# Patient Record
Sex: Female | Born: 2000 | Race: White | Hispanic: No | Marital: Single | Smoking: Never smoker
Health system: Southern US, Community
[De-identification: ages and names within clinical notes are randomized; demographics above are authoritative.]

## PROBLEM LIST (undated history)

## (undated) DIAGNOSIS — G43909 Migraine, unspecified, not intractable, without status migrainosus: Secondary | ICD-10-CM

## (undated) DIAGNOSIS — T7840XA Allergy, unspecified, initial encounter: Secondary | ICD-10-CM

## (undated) HISTORY — PX: MOUTH SURGERY: SHX715

---
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
## 2020-01-27 ENCOUNTER — Other Ambulatory Visit: Payer: Self-pay

## 2020-01-27 ENCOUNTER — Ambulatory Visit
Admission: EM | Admit: 2020-01-27 | Discharge: 2020-01-27 | Disposition: A | Discharge status: Home / Self Care | Payer: PRIVATE HEALTH INSURANCE | Attending: Physician Assistant | Admitting: Physician Assistant

## 2020-01-27 DIAGNOSIS — J020 Streptococcal pharyngitis: Secondary | ICD-10-CM | POA: Diagnosis present

## 2020-01-27 DIAGNOSIS — R591 Generalized enlarged lymph nodes: Secondary | ICD-10-CM

## 2020-01-27 HISTORY — DX: Allergy, unspecified, initial encounter: T78.40XA

## 2020-01-27 HISTORY — DX: Migraine, unspecified, not intractable, without status migrainosus: G43.909

## 2020-01-27 LAB — GROUP A STREP BY PCR: Group A Strep by PCR: DETECTED — AB

## 2020-01-27 MED ORDER — AMOXICILLIN 500 MG PO CAPS: 500.0000 mg | ORAL_CAPSULE | Freq: Two times a day (BID) | ORAL | 0 refills | Status: AC

## 2020-01-27 MED ORDER — IBUPROFEN 800 MG PO TABS: 800.0000 mg | ORAL_TABLET | Freq: Three times a day (TID) | ORAL | 0 refills | Status: AC

## 2020-01-27 NOTE — ED Triage Notes (Addendum)
Pt with left neck swelling x past 2-3 weeks. 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 States it started out as regular "allergy" symptoms with runny nose but went away 2 weeks ago but the neck swelling and tenderness remains.

## 2020-01-27 NOTE — Discharge Instructions (Signed)
I will call with the results of your strep test.  You have declined a mono test and a blood draw today to check CBC.  Swollen lymph nodes can last for couple weeks but they usually go down.  Supportive care with ibuprofen as I have prescribed.  You can also take over-the-counter Tylenol for pain relief.  You can try warm compresses to see if that will help as well.  You should be seen again if your symptoms last more than 2 more weeks or you develop a fever or any symptoms with that including sore throat, cough or other symptoms.

## 2020-01-27 NOTE — ED Provider Notes (Addendum)
[facility]    CSN: [id number] Arrival date & time: 01/27/20  1544      History   Chief Complaint Chief Complaint  Patient presents with  . Lymphadenopathy    HPI Gail Harvey is a 19 y.o. female presenting for a swollen lymph node of the left side of her neck for the past 2 to 3 weeks.  She says that she did have a cough and some congestion at onset, but she started taking over-the-counter antihistamines for allergies and that improved the congestion and cough.  She denies any sore throat, but says that she thinks it feels weird when she swallows.  Denies any fever, fatigue, body aches, ear pain, sinus pain, cough, chest discomfort, breathing difficulty.  No known exposure to strep, mono, flu, Covid and no one has been ill around her recently.  She says that the lymph node has not gotten smaller or bigger.  She has occasionally taken over-the-counter NSAIDs with temporary improvement in the pain.  She has no other symptoms to report no other complaints today.  HPI  Past Medical History:  Diagnosis Date  . Allergies   . Migraines     There are no problems to display for this patient.   Past Surgical History:  Procedure Laterality Date  . MOUTH SURGERY      OB History   No obstetric history on file.      Home Medications    Prior to Admission medications   Medication Sig Start Date End Date Taking? Authorizing Provider  ibuprofen (ADVIL) 800 MG tablet Take 1 tablet (800 mg total) by mouth 3 (three) times daily for 10 days. 01/27/20 02/06/20  Eusebio Friendly B, PA-C  rizatriptan (MAXALT-MLT) 10 MG disintegrating tablet Take 1 tablet (10 mg total) by mouth as needed for migraine. May repeat in 2 hours if needed 06/05/16   Payton Mccallum, MD    Family History History reviewed. No pertinent family history.  Social History Social History   Tobacco Use  . Smoking status: Never Smoker  . Smokeless tobacco: Never Used  Vaping Use  . Vaping Use: Never used    Substance Use Topics  . Alcohol use: No  . Drug use: No     Allergies   Patient has no known allergies.   Review of Systems Review of Systems  Constitutional: Negative for chills, diaphoresis, fatigue and fever.  HENT: Negative for congestion, ear pain, rhinorrhea, sinus pressure, sinus pain and sore throat.   Respiratory: Negative for cough and shortness of breath.   Gastrointestinal: Negative for abdominal pain, nausea and vomiting.  Musculoskeletal: Negative for arthralgias and myalgias.  Skin: Negative for rash.  Neurological: Negative for weakness and headaches.  Hematological: Positive for adenopathy.     Physical Exam Triage Vital Signs ED Triage Vitals  Enc Vitals Group     BP 01/27/20 1611 125/75     Pulse Rate 01/27/20 1611 79     Resp 01/27/20 1611 16     Temp 01/27/20 1611 98.1 F (36.7 C)     Temp Source 01/27/20 1611 Oral     SpO2 01/27/20 1611 100 %     Weight 01/27/20 1604 160 lb (72.6 kg)     Height 01/27/20 1604 5\' 7"  (1.702 m)     Head Circumference --      Peak Flow --      Pain Score 01/27/20 1604 6     Pain Loc --      Pain  Edu? --      Excl. in GC? --    No data found.  Updated Vital Signs BP 125/75 (BP Location: Left Arm)   Pulse 79   Temp 98.1 F (36.7 C) (Oral)   Resp 16   Ht 5\' 7"  (1.702 m)   Wt 160 lb (72.6 kg)   LMP 01/19/2020   SpO2 100%   BMI 25.06 kg/m    Physical Exam Vitals and nursing note reviewed.  Constitutional:      General: She is not in acute distress.    Appearance: Normal appearance. She is not ill-appearing or toxic-appearing.  HENT:     Head: Normocephalic and atraumatic.     Right Ear: Tympanic membrane, ear canal and external ear normal.     Left Ear: Tympanic membrane, ear canal and external ear normal.     Nose: Nose normal.     Mouth/Throat:     Mouth: Mucous membranes are moist.     Pharynx: Oropharynx is clear. No posterior oropharyngeal erythema.  Eyes:     General: No scleral icterus.        Right eye: No discharge.        Left eye: No discharge.     Conjunctiva/sclera: Conjunctivae normal.  Cardiovascular:     Rate and Rhythm: Normal rate and regular rhythm.     Heart sounds: Normal heart sounds.  Pulmonary:     Effort: Pulmonary effort is normal. No respiratory distress.     Breath sounds: Normal breath sounds.  Musculoskeletal:     Cervical back: Neck supple.  Lymphadenopathy:     Cervical: Cervical adenopathy (small enlarged and tender lymph node anterior cervical region. It is TTP) present.  Skin:    General: Skin is dry.  Neurological:     General: No focal deficit present.     Mental Status: She is alert. Mental status is at baseline.     Motor: No weakness.     Gait: Gait normal.  Psychiatric:        Mood and Affect: Mood normal.        Behavior: Behavior normal.        Thought Content: Thought content normal.      UC Treatments / Results  Labs (all labs ordered are listed, but only abnormal results are displayed) Labs Reviewed  GROUP A STREP BY PCR    EKG   Radiology No results found.  Procedures Procedures (including critical care time)  Medications Ordered in UC Medications - No data to display  Initial Impression / Assessment and Plan / UC Course  I have reviewed the triage vital signs and the nursing notes.  Pertinent labs & imaging results that were available during my care of the patient were reviewed by me and considered in my medical decision making (see chart for details).    19 year old female presenting for swollen lymph node of the left side of her neck for the past 2 to 3 weeks.  Strep testing obtained today.  Advised patient we will call her with results if positive.  I did offer her a mono test and a CBC.  Patient declined at this time and agreed to go forward with conservative treatment.  I have sent ibuprofen to the pharmacy for her and advised increasing rest and fluids.  I reassured her that most lymph node swelling  comes down in a couple of weeks.  Advised her to make follow-up with her PCP if she needs further work-up  including the testing that I have offered and/or ultrasound of the region.  Advised her to follow-up sooner here if she develops any other symptoms.  She is agreeable.  Molecular strep test positive discussed results with patient.  Advised her that I sent amoxicillin.  Advised her to take the full course and explained that the lymph node should go down as the infection started to clear up.  Advised her to follow-up with our department as needed.   Final Clinical Impressions(s) / UC Diagnoses   Final diagnoses:  Lymphadenopathy of head and neck     Discharge Instructions     I will call with the results of your strep test.  You have declined a mono test and a blood draw today to check CBC.  Swollen lymph nodes can last for couple weeks but they usually go down.  Supportive care with ibuprofen as I have prescribed.  You can also take over-the-counter Tylenol for pain relief.  You can try warm compresses to see if that will help as well.  You should be seen again if your symptoms last more than 2 more weeks or you develop a fever or any symptoms with that including sore throat, cough or other symptoms.   ED Prescriptions    Medication Sig Dispense Auth. Provider   ibuprofen (ADVIL) 800 MG tablet Take 1 tablet (800 mg total) by mouth 3 (three) times daily for 10 days. 30 tablet Gareth Morgan     PDMP not reviewed this encounter.   Shirlee Latch, PA-C 01/27/20 1657    Eusebio Friendly B, PA-C 01/27/20 1800

## 2020-05-09 ENCOUNTER — Other Ambulatory Visit: Payer: Self-pay

## 2020-05-09 ENCOUNTER — Ambulatory Visit (INDEPENDENT_AMBULATORY_CARE_PROVIDER_SITE_OTHER): Payer: PRIVATE HEALTH INSURANCE

## 2020-05-09 ENCOUNTER — Ambulatory Visit
Admission: RE | Admit: 2020-05-09 | Discharge: 2020-05-09 | Disposition: A | Discharge status: Home / Self Care | Payer: PRIVATE HEALTH INSURANCE | Source: Ambulatory Visit | Attending: Physician Assistant | Admitting: Physician Assistant

## 2020-05-09 VITALS — BP 120/76 | HR 77 | Temp 98.5°F | Resp 18 | Ht 67.0 in | Wt 160.1 lb

## 2020-05-09 DIAGNOSIS — M7712 Lateral epicondylitis, left elbow: Secondary | ICD-10-CM

## 2020-05-09 DIAGNOSIS — M25522 Pain in left elbow: Secondary | ICD-10-CM | POA: Diagnosis not present

## 2020-05-09 DIAGNOSIS — X500XXA Overexertion from strenuous movement or load, initial encounter: Secondary | ICD-10-CM

## 2020-05-09 MED ORDER — DICLOFENAC SODIUM 1 % EX GEL: 2.0000 g | Freq: Four times a day (QID) | CUTANEOUS | 0 refills | Status: AC

## 2020-05-09 MED ORDER — NAPROXEN 500 MG PO TABS: 500.0000 mg | ORAL_TABLET | Freq: Two times a day (BID) | ORAL | 0 refills | Status: AC

## 2020-05-09 NOTE — ED Triage Notes (Signed)
Pt c/o left elbow pain. Started about 3 weeks ago. 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 She has tried muscle relaxer and a compression sleeve and pain is still getting worse.

## 2020-05-09 NOTE — Discharge Instructions (Addendum)
Your x-ray is normal today.  You likely have inflammation at the lateral epicondyle.  This is probably due to overuse of the elbow.  Try to reduce your workload a little.  I have sent a topical anti-inflammatory gel for you to use as well as an oral anti-inflammatory.  You can also take Tylenol up to 2 tablets 3 times a day if needed for more pain relief.  Also continue to use ice and/or heat if that seems to help.  He may need to follow-up with orthopedics if this is not getting better over the next couple of weeks.  They may consider physical therapy.

## 2020-05-09 NOTE — ED Provider Notes (Signed)
[facility]    CSN: [id number] Arrival date & time: 05/09/20  1547      History   Chief Complaint Chief Complaint  Patient presents with  . Elbow Pain    left    HPI RASHONDA WARRIOR is a 20 y.o. female presenting for left elbow pain for about 3 weeks.  Patient says that her pain is constant and about 3 out of 10 during the day and more severe in the evening.  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  Patient is right-handed.  Denies any problems with the right extremity.  Patient says the entire elbow hurts.  Denies any radiation pain up or down the arm.  Denies any associated swelling, bruising, or redness.  Patient has taken muscle relaxers at bedtime and says that helps her sleep sometimes.  She says she is typically okay during the day but symptoms always worsen at night.  Patient says that she is off on the weekends and her pain does not really bother her.  She has tried ibuprofen a couple of times without any relief.  Patient denies any major injuries involving this extremity in the past.  She says she has never had any trauma to the elbow or arm.  No previous surgeries.  Denies any other complaints.  HPI  Past Medical History:  Diagnosis Date  . Allergies   . Migraines     There are no problems to display for this patient.   Past Surgical History:  Procedure Laterality Date  . MOUTH SURGERY      OB History   No obstetric history on file.      Home Medications    Prior to Admission medications   Medication Sig Start Date End Date Taking? Authorizing Provider  diclofenac Sodium (VOLTAREN) 1 % GEL Apply 2 g topically 4 (four) times daily for 7 days. 05/09/20 05/16/20 Yes Eusebio Friendly B, PA-C  naproxen (NAPROSYN) 500 MG tablet Take 1 tablet (500 mg total) by mouth 2 (two) times daily. 05/09/20 06/08/20 Yes Eusebio Friendly B, PA-C  rizatriptan  (MAXALT-MLT) 10 MG disintegrating tablet Take 1 tablet (10 mg total) by mouth as needed for migraine. May repeat in 2 hours if needed 06/05/16   Payton Mccallum, MD    Family History History reviewed. No pertinent family history.  Social History Social History   Tobacco Use  . Smoking status: Never Smoker  . Smokeless tobacco: Never Used  Vaping Use  . Vaping Use: Never used  Substance Use Topics  . Alcohol use: No  . Drug use: No     Allergies   Patient has no known allergies.   Review of Systems Review of Systems  Constitutional: Negative for fatigue and fever.  Musculoskeletal: Positive for arthralgias. Negative for joint swelling.  Skin: Negative for rash and wound.  Neurological: Negative for weakness and numbness.     Physical Exam Triage Vital Signs ED Triage Vitals  Enc Vitals Group     BP 05/09/20 1558 120/76     Pulse Rate 05/09/20 1558 77     Resp 05/09/20 1558 18     Temp 05/09/20 1558 98.5 F (36.9 C)     Temp Source 05/09/20 1558 Oral     SpO2 05/09/20 1558 100 %     Weight 05/09/20 1556 160 lb 0.9 oz (  72.6 kg)     Height 05/09/20 1556 5\' 7"  (1.702 m)     Head Circumference --      Peak Flow --      Pain Score 05/09/20 1556 7     Pain Loc --      Pain Edu? --      Excl. in GC? --    No data found.  Updated Vital Signs BP 120/76 (BP Location: Right Arm)   Pulse 77   Temp 98.5 F (36.9 C) (Oral)   Resp 18   Ht 5\' 7"  (1.702 m)   Wt 160 lb 0.9 oz (72.6 kg)   LMP 04/16/2020 (Approximate)   SpO2 100%   BMI 25.07 kg/m       Physical Exam Vitals and nursing note reviewed.  Constitutional:      General: She is not in acute distress.    Appearance: Normal appearance. She is not ill-appearing or toxic-appearing.  HENT:     Head: Normocephalic and atraumatic.  Eyes:     General: No scleral icterus.       Right eye: No discharge.        Left eye: No discharge.     Conjunctiva/sclera: Conjunctivae normal.  Cardiovascular:     Rate and  Rhythm: Normal rate and regular rhythm.     Pulses: Normal pulses.  Pulmonary:     Effort: Pulmonary effort is normal. No respiratory distress.  Musculoskeletal:     Left elbow: No swelling. Normal range of motion. Tenderness present in medial epicondyle and lateral epicondyle.     Cervical back: Neck supple.     Comments: No TTP of extensor or flexor tendons of forearm. Full ROM, but some pain with internal rotation  Skin:    General: Skin is dry.  Neurological:     General: No focal deficit present.     Mental Status: She is alert. Mental status is at baseline.     Motor: No weakness.     Gait: Gait normal.  Psychiatric:        Mood and Affect: Mood normal.        Behavior: Behavior normal.        Thought Content: Thought content normal.      UC Treatments / Results  Labs (all labs ordered are listed, but only abnormal results are displayed) Labs Reviewed - No data to display  EKG   Radiology DG Elbow Complete Left  Result Date: 05/09/2020 CLINICAL DATA:  Is elbow pain EXAM: LEFT ELBOW - COMPLETE 3+ VIEW COMPARISON:  None. FINDINGS: There is no evidence of fracture, dislocation, or joint effusion. There is no evidence of arthropathy or other focal bone abnormality. Soft tissues are unremarkable. IMPRESSION: Negative. Electronically Signed   By: 04/18/2020 M.D.   On: 05/09/2020 16:35    Procedures Procedures (including critical care time)  Medications Ordered in UC Medications - No data to display  Initial Impression / Assessment and Plan / UC Course  I have reviewed the triage vital signs and the nursing notes.  Pertinent labs & imaging results that were available during my care of the patient were reviewed by me and considered in my medical decision making (see chart for details).   20 year old female presenting for left elbow pain for a couple of weeks.  No specific injury but does overuse the elbow on a daily basis for work.  Imaging obtained today which is  negative for any acute bony abnormality.  Advised patient  that her exam is consistent with suspected lateral epicondylitis and also probably some level of medial epicondylitis.  Suggested supportive care at this time with RICE and Voltaren gel as well as naproxen and Tylenol for pain relief.  Encouraged her to follow-up with orthopedics if not getting better in the next couple weeks as she may need physical therapy or other treatment.  Work note provided for lighter duty for the next week.   Final Clinical Impressions(s) / UC Diagnoses   Final diagnoses:  Left elbow pain  Lateral epicondylitis, left elbow     Discharge Instructions     Your x-ray is normal today.  He will likely have inflammation at the lateral epicondyle.  This is probably due to overuse of the elbow.  Try to reduce your workload a little.  I have sent a topical anti-inflammatory gel for you to use as well as an oral anti-inflammatory.  You can also take Tylenol up to 2 tablets 3 times a day if needed for more pain relief.  Also continue to use ice and/or heat if that seems to help.  He may need to follow-up with orthopedics if this is not getting better over the next couple of weeks.  They may consider physical therapy.    ED Prescriptions    Medication Sig Dispense Auth. Provider   diclofenac Sodium (VOLTAREN) 1 % GEL Apply 2 g topically 4 (four) times daily for 7 days. 50 g Eusebio Friendly B, PA-C   naproxen (NAPROSYN) 500 MG tablet Take 1 tablet (500 mg total) by mouth 2 (two) times daily. 60 tablet Gareth Morgan     PDMP not reviewed this encounter.   Shirlee Latch, PA-C 05/09/20 1659
